# Patient Record
Sex: Female | Born: 1980 | Race: White | Hispanic: No | Marital: Single | Smoking: Current every day smoker
Health system: Southern US, Community
[De-identification: ages and names within clinical notes are randomized; demographics above are authoritative.]

## PROBLEM LIST (undated history)

## (undated) DIAGNOSIS — G43909 Migraine, unspecified, not intractable, without status migrainosus: Secondary | ICD-10-CM

## (undated) DIAGNOSIS — F32A Depression, unspecified: Secondary | ICD-10-CM

## (undated) DIAGNOSIS — F329 Major depressive disorder, single episode, unspecified: Secondary | ICD-10-CM

## (undated) DIAGNOSIS — M419 Scoliosis, unspecified: Secondary | ICD-10-CM

## (undated) DIAGNOSIS — F419 Anxiety disorder, unspecified: Secondary | ICD-10-CM

## (undated) DIAGNOSIS — J45909 Unspecified asthma, uncomplicated: Secondary | ICD-10-CM

---

## 2014-01-27 ENCOUNTER — Emergency Department (HOSPITAL_COMMUNITY): Payer: BC Managed Care – PPO

## 2014-01-27 ENCOUNTER — Emergency Department (HOSPITAL_COMMUNITY)
Admission: EM | Admit: 2014-01-27 | Discharge: 2014-01-27 | Disposition: A | Discharge status: Home / Self Care | Payer: BC Managed Care – PPO | Attending: Emergency Medicine | Admitting: Emergency Medicine

## 2014-01-27 ENCOUNTER — Encounter (HOSPITAL_COMMUNITY): Payer: Self-pay | Admitting: Emergency Medicine

## 2014-01-27 DIAGNOSIS — R5383 Other fatigue: Secondary | ICD-10-CM

## 2014-01-27 DIAGNOSIS — R42 Dizziness and giddiness: Secondary | ICD-10-CM | POA: Insufficient documentation

## 2014-01-27 DIAGNOSIS — Z8739 Personal history of other diseases of the musculoskeletal system and connective tissue: Secondary | ICD-10-CM | POA: Insufficient documentation

## 2014-01-27 DIAGNOSIS — R5381 Other malaise: Secondary | ICD-10-CM | POA: Insufficient documentation

## 2014-01-27 DIAGNOSIS — F329 Major depressive disorder, single episode, unspecified: Secondary | ICD-10-CM | POA: Insufficient documentation

## 2014-01-27 DIAGNOSIS — N12 Tubulo-interstitial nephritis, not specified as acute or chronic: Secondary | ICD-10-CM

## 2014-01-27 DIAGNOSIS — R110 Nausea: Secondary | ICD-10-CM | POA: Insufficient documentation

## 2014-01-27 DIAGNOSIS — Z79899 Other long term (current) drug therapy: Secondary | ICD-10-CM | POA: Insufficient documentation

## 2014-01-27 DIAGNOSIS — Z3202 Encounter for pregnancy test, result negative: Secondary | ICD-10-CM | POA: Insufficient documentation

## 2014-01-27 DIAGNOSIS — Z8679 Personal history of other diseases of the circulatory system: Secondary | ICD-10-CM | POA: Insufficient documentation

## 2014-01-27 DIAGNOSIS — F411 Generalized anxiety disorder: Secondary | ICD-10-CM | POA: Insufficient documentation

## 2014-01-27 DIAGNOSIS — J45909 Unspecified asthma, uncomplicated: Secondary | ICD-10-CM | POA: Insufficient documentation

## 2014-01-27 DIAGNOSIS — F17200 Nicotine dependence, unspecified, uncomplicated: Secondary | ICD-10-CM | POA: Insufficient documentation

## 2014-01-27 DIAGNOSIS — F3289 Other specified depressive episodes: Secondary | ICD-10-CM | POA: Insufficient documentation

## 2014-01-27 HISTORY — DX: Major depressive disorder, single episode, unspecified: F32.9

## 2014-01-27 HISTORY — DX: Migraine, unspecified, not intractable, without status migrainosus: G43.909

## 2014-01-27 HISTORY — DX: Depression, unspecified: F32.A

## 2014-01-27 HISTORY — DX: Scoliosis, unspecified: M41.9

## 2014-01-27 HISTORY — DX: Unspecified asthma, uncomplicated: J45.909

## 2014-01-27 HISTORY — DX: Anxiety disorder, unspecified: F41.9

## 2014-01-27 LAB — COMPREHENSIVE METABOLIC PANEL
ALT: 17 U/L (ref 0–35)
AST: 16 U/L (ref 0–37)
Albumin: 3.8 g/dL (ref 3.5–5.2)
Alkaline Phosphatase: 88 U/L (ref 39–117)
BUN: 14 mg/dL (ref 6–23)
CO2: 24 meq/L (ref 19–32)
CREATININE: 0.74 mg/dL (ref 0.50–1.10)
Calcium: 9.3 mg/dL (ref 8.4–10.5)
Chloride: 103 mEq/L (ref 96–112)
GLUCOSE: 80 mg/dL (ref 70–99)
Potassium: 4.3 mEq/L (ref 3.7–5.3)
Sodium: 140 mEq/L (ref 137–147)
Total Bilirubin: <0.2 mg/dL — ABNORMAL LOW (ref 0.3–1.2)
Total Protein: 7.5 g/dL (ref 6.0–8.3)

## 2014-01-27 LAB — URINALYSIS, ROUTINE W REFLEX MICROSCOPIC
Bilirubin Urine: NEGATIVE
Glucose, UA: NEGATIVE mg/dL
Ketones, ur: NEGATIVE mg/dL
Nitrite: NEGATIVE
Protein, ur: NEGATIVE mg/dL
SPECIFIC GRAVITY, URINE: 1.01 (ref 1.005–1.030)
UROBILINOGEN UA: 0.2 mg/dL (ref 0.0–1.0)
pH: 6 (ref 5.0–8.0)

## 2014-01-27 LAB — CBC WITH DIFFERENTIAL/PLATELET
Basophils Absolute: 0.1 10*3/uL (ref 0.0–0.1)
Basophils Relative: 1 % (ref 0–1)
EOS PCT: 8 % — AB (ref 0–5)
Eosinophils Absolute: 0.6 10*3/uL (ref 0.0–0.7)
HCT: 42 % (ref 36.0–46.0)
Hemoglobin: 14.2 g/dL (ref 12.0–15.0)
LYMPHS ABS: 2.2 10*3/uL (ref 0.7–4.0)
Lymphocytes Relative: 27 % (ref 12–46)
MCH: 32.3 pg (ref 26.0–34.0)
MCHC: 33.8 g/dL (ref 30.0–36.0)
MCV: 95.5 fL (ref 78.0–100.0)
MONO ABS: 0.5 10*3/uL (ref 0.1–1.0)
Monocytes Relative: 6 % (ref 3–12)
Neutro Abs: 4.7 10*3/uL (ref 1.7–7.7)
Neutrophils Relative %: 58 % (ref 43–77)
PLATELETS: 301 10*3/uL (ref 150–400)
RBC: 4.4 MIL/uL (ref 3.87–5.11)
RDW: 12.6 % (ref 11.5–15.5)
WBC: 8.1 10*3/uL (ref 4.0–10.5)

## 2014-01-27 LAB — URINE MICROSCOPIC-ADD ON

## 2014-01-27 LAB — PREGNANCY, URINE: Preg Test, Ur: NEGATIVE

## 2014-01-27 MED ORDER — SODIUM CHLORIDE 0.9 % IV BOLUS (SEPSIS)
2000.0000 mL | Freq: Once | INTRAVENOUS | Status: AC
  Administered 2014-01-27: 2000 mL via INTRAVENOUS

## 2014-01-27 MED ORDER — CEPHALEXIN 500 MG PO CAPS: 500.0000 mg | ORAL_CAPSULE | Freq: Four times a day (QID) | ORAL | Status: AC

## 2014-01-27 MED ORDER — DEXTROSE 5 % IV SOLN
1.0000 g | Freq: Once | INTRAVENOUS | Status: DC
  Filled 2014-01-27: qty 10

## 2014-01-27 MED ORDER — ONDANSETRON HCL 4 MG/2ML IJ SOLN: 4.0000 mg | Freq: Once | INTRAMUSCULAR | Status: DC

## 2014-01-27 NOTE — ED Notes (Signed)
Per pt, has been diagnosed with a stone years ago.  Stone is an "acid stone" and cannot be seen on CT.  Pt states last evaluated with a flare up in 2012.  Pt feels same as then with dizziness and flank pain.

## 2014-01-27 NOTE — ED Notes (Signed)
Initial Contact - pt to [facility] with visitor, reports c/o flank pain, hx "acid stone", skin PWD, speaking full/clear sentences, ambulating with steady gait.  NAD. Awaiting EDP eval.

## 2014-01-27 NOTE — ED Notes (Signed)
Pt to CT

## 2014-01-27 NOTE — ED Notes (Signed)
Pt declining zofran at this time.

## 2014-01-27 NOTE — ED Provider Notes (Signed)
CSN: [id number]     Arrival date & time 01/27/14  1531 History   First MD Initiated Contact with Patient 01/27/14 1733     Chief Complaint  Patient presents with  . Flank Pain     (Consider location/radiation/quality/duration/timing/severity/associated sxs/prior Treatment) HPI Comments: 33 year old female with right-sided flank pain since yesterday. It feels similar to multiple prior pains she's had. She's been told she has "an acid stone" that cannot be seen on CT. She states she's had multiple UTIs has never had a procedure urologically. She's never had stone out of the kidney. She states it feels like a dull throbbing pain in her right back. She states that she's been feeling dizzy and lightheaded today. She states she feels dehydrated with excessive thirst. Usually when this happens she is getting fluids and antibiotics for a UTI. She's not specifically having dysuria. No fevers or vomiting. She is nauseous. No abdominal pain. She states her right upper quadrant is always "tender" for the past 20 years but she's had her gallbladder and liver evaluated multiple times and never found any issues. Her abdomen is not hurting currently. She has never seen a urologist. She states her pain is currently about a 2/10. It does improve with sitting up.   Past Medical History  Diagnosis Date  . Scoliosis   . Depression   . Anxiety   . Asthma   . Migraines    History reviewed. No pertinent past surgical history. History reviewed. No pertinent family history. History  Substance Use Topics  . Smoking status: Current Every Day Smoker -- 0.50 packs/day    Types: Cigarettes  . Smokeless tobacco: Not on file  . Alcohol Use: No   OB History   Grav Para Term Preterm Abortions TAB SAB Ect Mult Living                 Review of Systems  Constitutional: Negative for fever.  Respiratory: Negative for shortness of breath.   Gastrointestinal: Positive for nausea. Negative for vomiting and abdominal pain.   Genitourinary: Positive for flank pain. Negative for dysuria and hematuria.  Musculoskeletal: Positive for back pain.  Neurological: Positive for weakness and light-headedness.  All other systems reviewed and are negative.      Allergies  Cymbalta and Prozac  Home Medications   Current Outpatient Rx  Name  Route  Sig  Dispense  Refill  . aspirin-acetaminophen-caffeine (EXCEDRIN MIGRAINE) 250-250-65 MG per tablet   Oral   Take 1 tablet by mouth every 6 (six) hours as needed for headache.         . diclofenac sodium (VOLTAREN) 1 % GEL   Topical   Apply 2 g topically 4 (four) times daily as needed (pain).         Marland Kitchen escitalopram (LEXAPRO) 20 MG tablet   Oral   Take 1 tablet by mouth daily.          BP 122/86  Pulse 86  Temp(Src) 98.7 F (37.1 C) (Oral)  Resp 16  SpO2 100%  LMP 01/24/2014 Physical Exam  Vitals reviewed. Constitutional: She is oriented to person, place, and time. She appears well-developed and well-nourished. No distress.  HENT:  Head: Normocephalic and atraumatic.  Right Ear: External ear normal.  Left Ear: External ear normal.  Nose: Nose normal.  Eyes: Right eye exhibits no discharge. Left eye exhibits no discharge.  Cardiovascular: Normal rate, regular rhythm and normal heart sounds.   Pulmonary/Chest: Effort normal and breath sounds normal.  Abdominal:  Soft. There is tenderness in the right upper quadrant. There is CVA tenderness (right-sided).  Neurological: She is alert and oriented to person, place, and time.  Skin: Skin is warm and dry.    ED Course  Procedures (including critical care time) Labs Review Labs Reviewed  CBC WITH DIFFERENTIAL - Abnormal; Notable for the following:    Eosinophils Relative 8 (*)    All other components within normal limits  COMPREHENSIVE METABOLIC PANEL - Abnormal; Notable for the following:    Total Bilirubin <0.2 (*)    All other components within normal limits  URINALYSIS, ROUTINE W REFLEX  MICROSCOPIC - Abnormal; Notable for the following:    Color, Urine GREEN (*)    APPearance CLOUDY (*)    Hgb urine dipstick MODERATE (*)    Leukocytes, UA MODERATE (*)    All other components within normal limits  URINE MICROSCOPIC-ADD ON - Abnormal; Notable for the following:    Squamous Epithelial / LPF FEW (*)    Bacteria, UA FEW (*)    All other components within normal limits  URINE CULTURE  PREGNANCY, URINE   Imaging Review Ct Abdomen Pelvis Wo Contrast  01/27/2014   CLINICAL DATA:  Right flank pain.  Urolithiasis.  EXAM: CT ABDOMEN AND PELVIS WITHOUT CONTRAST  TECHNIQUE: Multidetector CT imaging of the abdomen and pelvis was performed following the standard protocol without intravenous contrast.  COMPARISON:  None.  FINDINGS: No evidence of renal calculi or hydronephrosis. No evidence of ureteral calculi or dilatation. No bladder calculi identified.  Noncontrast images of the liver, gallbladder, pancreas, spleen, and adrenal glands are normal in appearance. Uterus and adnexal regions are unremarkable. No lymphadenopathy identified within the abdomen or pelvis. No evidence of inflammatory process or abnormal fluid collections. No evidence of dilated bowel loops.  IMPRESSION: No evidence of urolithiasis, hydronephrosis, or other acute findings.   Electronically Signed   By: Myles RosenthalJohn  Stahl M.D.   On: 01/27/2014 18:24     EKG Interpretation None      MDM   Final diagnoses:  Pyelonephritis    There is no evidence of ureteral or obstructing stone. Does have UTI, combined with her R flank pain will treat as if pyelo. Patient reportedly dizzy, likely from some dehydration as she feels she's not been drinking enough. After patient stuck for IV and it blew, she declined further IV fluids. She's had no vomiting and appears capable of rehydrating herself orally. Will treat with abx, oral fluids at home and give uro f/u given her recurrent symptoms and no current diagnosis of "acid  stone".    Audree CamelScott T Stefana Lodico, MD 01/28/14 [phone]

## 2014-01-27 NOTE — ED Notes (Signed)
Dr. Criss AlvineGoldston notified 2nd IV infiltrated, pt refusing more attempts at this time, reports feeling better.  Dr. Criss AlvineGoldston to bs for eval.

## 2014-01-29 LAB — URINE CULTURE: Colony Count: 50000

## 2014-03-17 ENCOUNTER — Emergency Department (HOSPITAL_BASED_OUTPATIENT_CLINIC_OR_DEPARTMENT_OTHER)
Admission: EM | Admit: 2014-03-17 | Discharge: 2014-03-17 | Disposition: A | Discharge status: Home / Self Care | Payer: BC Managed Care – PPO | Attending: Emergency Medicine | Admitting: Emergency Medicine

## 2014-03-17 ENCOUNTER — Encounter (HOSPITAL_BASED_OUTPATIENT_CLINIC_OR_DEPARTMENT_OTHER): Payer: Self-pay | Admitting: Emergency Medicine

## 2014-03-17 DIAGNOSIS — Z792 Long term (current) use of antibiotics: Secondary | ICD-10-CM | POA: Insufficient documentation

## 2014-03-17 DIAGNOSIS — F419 Anxiety disorder, unspecified: Secondary | ICD-10-CM

## 2014-03-17 DIAGNOSIS — M546 Pain in thoracic spine: Secondary | ICD-10-CM | POA: Insufficient documentation

## 2014-03-17 DIAGNOSIS — R209 Unspecified disturbances of skin sensation: Secondary | ICD-10-CM | POA: Insufficient documentation

## 2014-03-17 DIAGNOSIS — G43909 Migraine, unspecified, not intractable, without status migrainosus: Secondary | ICD-10-CM | POA: Insufficient documentation

## 2014-03-17 DIAGNOSIS — M4120 Other idiopathic scoliosis, site unspecified: Secondary | ICD-10-CM | POA: Insufficient documentation

## 2014-03-17 DIAGNOSIS — Z79899 Other long term (current) drug therapy: Secondary | ICD-10-CM | POA: Insufficient documentation

## 2014-03-17 DIAGNOSIS — F17200 Nicotine dependence, unspecified, uncomplicated: Secondary | ICD-10-CM | POA: Insufficient documentation

## 2014-03-17 DIAGNOSIS — J45909 Unspecified asthma, uncomplicated: Secondary | ICD-10-CM | POA: Insufficient documentation

## 2014-03-17 DIAGNOSIS — F411 Generalized anxiety disorder: Secondary | ICD-10-CM | POA: Insufficient documentation

## 2014-03-17 MED ORDER — LORAZEPAM 1 MG PO TABS
1.0000 mg | ORAL_TABLET | Freq: Once | ORAL | Status: AC
  Administered 2014-03-17: 1 mg via ORAL
  Filled 2014-03-17: qty 1

## 2014-03-17 MED ORDER — LORAZEPAM 1 MG PO TABS: 1.0000 mg | ORAL_TABLET | Freq: Three times a day (TID) | ORAL | Status: AC | PRN

## 2014-03-17 NOTE — ED Notes (Addendum)
Pt c/o increased depression and anxiety x 2 days pt denies SI

## 2014-03-17 NOTE — ED Provider Notes (Signed)
CSN: [id number]     Arrival date & time 03/17/14  2125 History  This chart was scribed for Rolan BuccoMelanie Zyona Pettaway, MD by Ellin MayhewMichael Levi, ED Scribe. This patient was seen in room MH12/MH12 and the patient's care was started at 10:09 PM.  The history is provided by the patient. No language interpreter was used.   HPI Comments: Miranda Schwartz is a 33 y.o. female who presents to the Emergency Department with a chief complaint of recurrent anxiety and depression. Patient states she has a history of chronic depression and anxiety that has been ongoing since she was 33 years old. Patient reports that last night at work she had an panic attack and had a subsequent panic attack on her way to work this morning. She reports she has had similar episodes in the past; however, that it had been several years since her last panic attack. Patient states she has been taking Lexapro for her depression for 5 months with a recent dosage increase occuring one month ago. Patient states she sees Maris Berger[place]athy [place]irst[place] in [place] for her psychiatric symptom management. Additionally, patient reports that she has had recurrent insomnia, which is not a new symptom. She denies any SI, HI or hallucinations.  With her panic attacks, she has had some tingling to the left side of the mouth, some tingling to the right hand.  Denies ataxia, no speech changes/facial drooping, no headache, no weakness to the extremities.   Past Medical History  Diagnosis Date  . Scoliosis   . Depression   . Anxiety   . Asthma   . Migraines    History reviewed. No pertinent past surgical history. History reviewed. No pertinent family history. History  Substance Use Topics  . Smoking status: Current Every Day Smoker -- 0.50 packs/day    Types: Cigarettes  . Smokeless tobacco: Not on file  . Alcohol Use: No   OB History   Grav Para Term Preterm Abortions TAB SAB Ect Mult Living                 Review of Systems  Constitutional: Negative for fever,  chills, diaphoresis and fatigue.  HENT: Negative for congestion, rhinorrhea and sneezing.   Eyes: Negative.   Respiratory: Negative for cough, chest tightness and shortness of breath.   Cardiovascular: Negative for chest pain and leg swelling.  Gastrointestinal: Negative for nausea, vomiting, abdominal pain, diarrhea and blood in stool.  Genitourinary: Negative for frequency, hematuria, flank pain and difficulty urinating.  Musculoskeletal: Positive for back pain (Upper back pain, chronic from scoliosis). Negative for arthralgias.  Skin: Negative for rash.  Neurological: Positive for numbness (paresthesias). Negative for dizziness, speech difficulty, weakness and headaches.  Psychiatric/Behavioral: Positive for sleep disturbance (Insomnia) and dysphoric mood. Negative for suicidal ideas, hallucinations, behavioral problems, confusion and self-injury. The patient is nervous/anxious.   All other systems reviewed and are negative.  Allergies  Cymbalta and Prozac  Home Medications   Prior to Admission medications   Medication Sig Start Date End Date Taking? Authorizing Provider  aspirin-acetaminophen-caffeine (EXCEDRIN MIGRAINE) 504-252-5021250-250-65 MG per tablet Take 1 tablet by mouth every 6 (six) hours as needed for headache.    Historical Provider, MD  cephALEXin (KEFLEX) 500 MG capsule Take 1 capsule (500 mg total) by mouth 4 (four) times daily. X 10 days 01/27/14   Audree CamelScott T Goldston, MD  diclofenac sodium (VOLTAREN) 1 % GEL Apply 2 g topically 4 (four) times daily as needed (pain).    Historical Provider, MD  escitalopram (LEXAPRO) 20  MG tablet Take 1 tablet by mouth daily. 12/02/13   Historical Provider, MD   Triage Vitals: BP 141/99  Pulse 81  Temp(Src) 97.9 F (36.6 C)  Resp 18  Ht 5\' 6"  (1.676 m)  Wt 200 lb (90.719 kg)  BMI 32.30 kg/m2  SpO2 100%  LMP 02/18/2014  Physical Exam  Constitutional: She is oriented to person, place, and time. She appears well-developed and well-nourished.   HENT:  Head: Normocephalic and atraumatic.  Eyes: Pupils are equal, round, and reactive to light.  Neck: Normal range of motion. Neck supple.  Cardiovascular: Normal rate, regular rhythm and normal heart sounds.   Pulmonary/Chest: Effort normal and breath sounds normal. No respiratory distress. She has no wheezes. She has no rales. She exhibits no tenderness.  Abdominal: Soft. Bowel sounds are normal. There is no tenderness. There is no rebound and no guarding.  Musculoskeletal: Normal range of motion. She exhibits no edema.  Lymphadenopathy:    She has no cervical adenopathy.  Neurological: She is alert and oriented to person, place, and time. She has normal strength. No cranial nerve deficit or sensory deficit.  Motor 5/5 all extremities.  Sensations grossly intact all extremities, no facial drooping, no aphasia.  Normal gait.  No pronator drift.  Skin: Skin is warm and dry. No rash noted.  Psychiatric: Her mood appears anxious. Her speech is not slurred. She is not withdrawn and not actively hallucinating. She exhibits a depressed mood. She expresses no homicidal and no suicidal ideation. She expresses no suicidal plans and no homicidal plans. She is communicative.    ED Course  Procedures (including critical care time)  COORDINATION OF CARE: 10:14 PM-Discussed treatment options for patient to reduce anxiety. Discussed that changes to the psychiatric medication regimen is best discussed with the psychiatrist. Treatment plan discussed with patient and patient agrees.  MDM   Final diagnoses:  Anxiety    Patient presents with anxiety and panic attacks. She has no suicidal ideations. She states she's been with depression all her life and has never had any thoughts or attempts at suicide. She was given a dose of Ativan in the ED and was feeling much better after this. All her paresthesias resolved. She was discharged home with her partner. She will followup with her psychiatrist tomorrow.  She was given 5 tablets of Ativan to use until she can see her psychiatrist.  I personally performed the services described in this documentation, which was scribed in my presence. The recorded information has been reviewed and is accurate.    Rolan BuccoMelanie Laronica Bhagat, MD 03/17/14 [phone]

## 2014-03-17 NOTE — Discharge Instructions (Signed)
Panic Attacks  Panic attacks are sudden, short-lived surges of severe anxiety, fear, or discomfort. They may occur for no reason when you are relaxed, when you are anxious, or when you are sleeping. Panic attacks may occur for a number of reasons:   · Healthy people occasionally have panic attacks in extreme, life-threatening situations, such as war or natural disasters. Normal anxiety is a protective mechanism of the body that helps us react to danger (fight or flight response).  · Panic attacks are often seen with anxiety disorders, such as panic disorder, social anxiety disorder, generalized anxiety disorder, and phobias. Anxiety disorders cause excessive or uncontrollable anxiety. They may interfere with your relationships or other life activities.  · Panic attacks are sometimes seen with other mental illnesses such as depression and posttraumatic stress disorder.  · Certain medical conditions, prescription medicines, and drugs of abuse can cause panic attacks.  SYMPTOMS   Panic attacks start suddenly, peak within 20 minutes, and are accompanied by four or more of the following symptoms:  · Pounding heart or fast heart rate (palpitations).  · Sweating.  · Trembling or shaking.  · Shortness of breath or feeling smothered.  · Feeling choked.  · Chest pain or discomfort.  · Nausea or strange feeling in your stomach.  · Dizziness, lightheadedness, or feeling like you will faint.  · Chills or hot flushes.  · Numbness or tingling in your lips or hands and feet.  · Feeling that things are not real or feeling that you are not yourself.  · Fear of losing control or going crazy.  · Fear of dying.  Some of these symptoms can mimic serious medical conditions. For example, you may think you are having a heart attack. Although panic attacks can be very scary, they are not life threatening.  DIAGNOSIS   Panic attacks are diagnosed through an assessment by your health care provider. Your health care provider will ask questions  about your symptoms, such as where and when they occurred. Your health care provider will also ask about your medical history and use of alcohol and drugs, including prescription medicines. Your health care provider may order blood tests or other studies to rule out a serious medical condition. Your health care provider may refer you to a mental health professional for further evaluation.  TREATMENT   · Most healthy people who have one or two panic attacks in an extreme, life-threatening situation will not require treatment.  · The treatment for panic attacks associated with anxiety disorders or other mental illness typically involves counseling with a mental health professional, medicine, or a combination of both. Your health care provider will help determine what treatment is best for you.  · Panic attacks due to physical illness usually goes away with treatment of the illness. If prescription medicine is causing panic attacks, talk with your health care provider about stopping the medicine, decreasing the dose, or substituting another medicine.  · Panic attacks due to alcohol or drug abuse goes away with abstinence. Some adults need professional help in order to stop drinking or using drugs.  HOME CARE INSTRUCTIONS   · Take all your medicines as prescribed.    · Check with your health care provider before starting new prescription or over-the-counter medicines.  · Keep all follow up appointments with your health care provider.  SEEK MEDICAL CARE IF:  · You are not able to take your medicines as prescribed.  · Your symptoms do not improve or get worse.  SEEK IMMEDIATE   MEDICAL CARE IF:   · You experience panic attack symptoms that are different than your usual symptoms.  · You have serious thoughts about hurting yourself or others.  · You are taking medicine for panic attacks and have a serious side effect.  MAKE SURE YOU:  · Understand these instructions.  · Will watch your condition.  · Will get help right away  if you are not doing well or get worse.  Document Released: 11/12/2005 Document Revised: 09/02/2013 Document Reviewed: 06/26/2013  [employer]® Patient Information ©2014 [employer].

## 2015-09-02 IMAGING — CT CT ABD-PELV W/O CM
1 series · 16 of 25 positions shown, 20 images · non-contrast
Comparison: None.

CLINICAL DATA: Right flank pain.  Urolithiasis.

EXAM:
CT ABDOMEN AND PELVIS WITHOUT CONTRAST
TECHNIQUE: Multidetector CT imaging of the abdomen and pelvis was performed
following the standard protocol without intravenous contrast.

[Series 6: lung · axial · 0.72mm/px · z∈[-576,-466]mm · 16 of 25 slices shown, 20 images]
[im 2/25  soft-tissue]
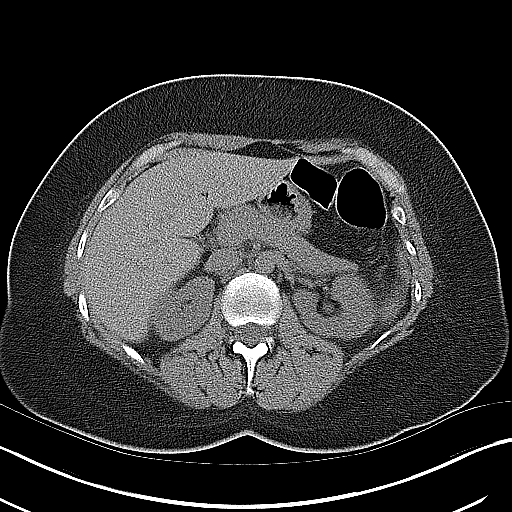
[im 2/25  bone]
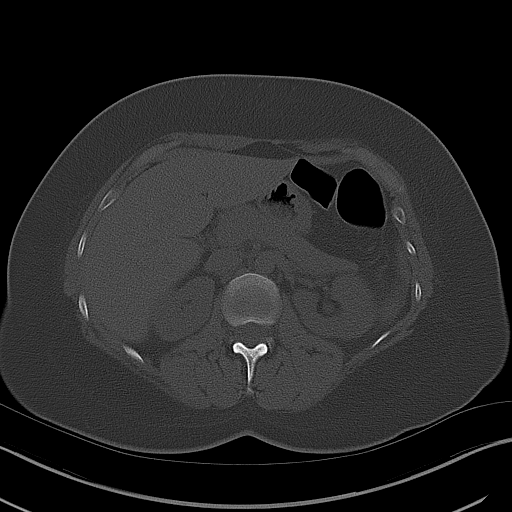
[im 4/25  soft-tissue]
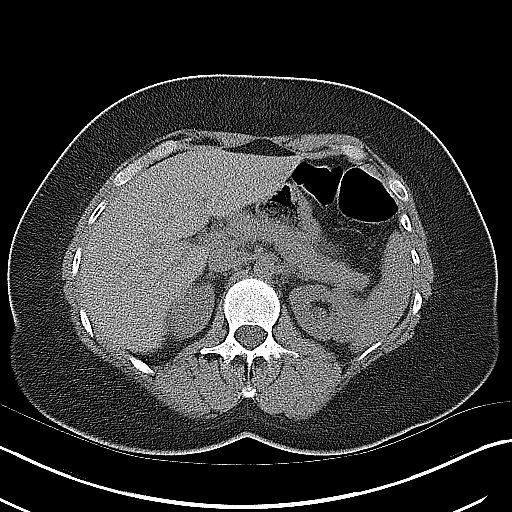
[im 6/25  soft-tissue]
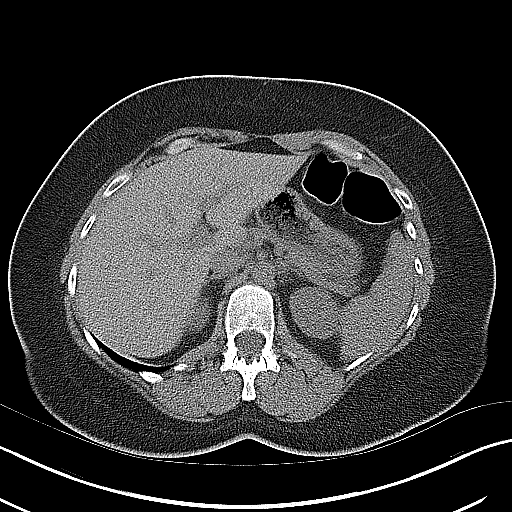
[im 7/25  soft-tissue]
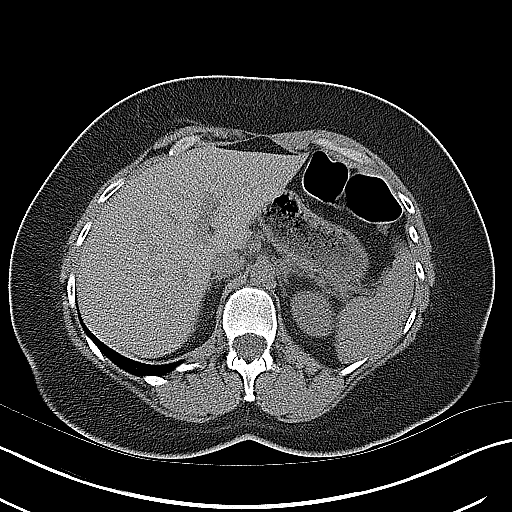
[im 9/25  soft-tissue]
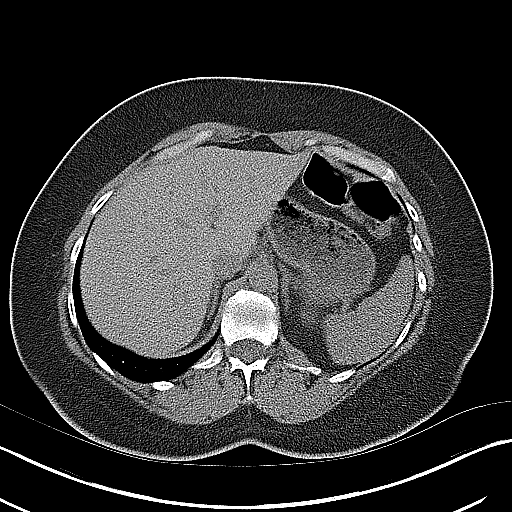
[im 11/25  soft-tissue]
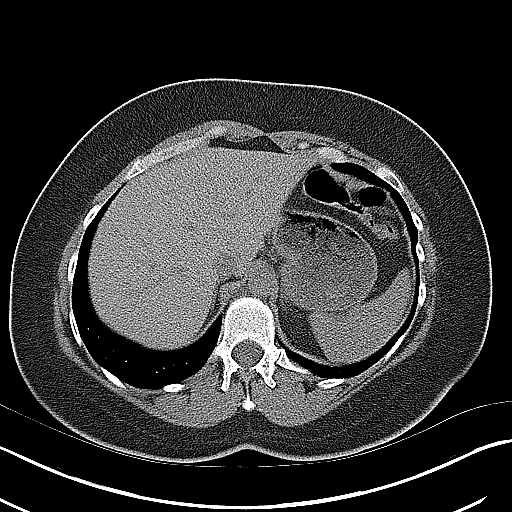
[im 12/25  soft-tissue]
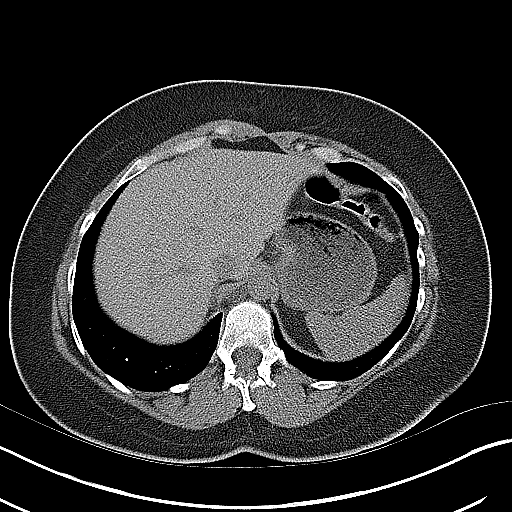
[im 14/25  soft-tissue]
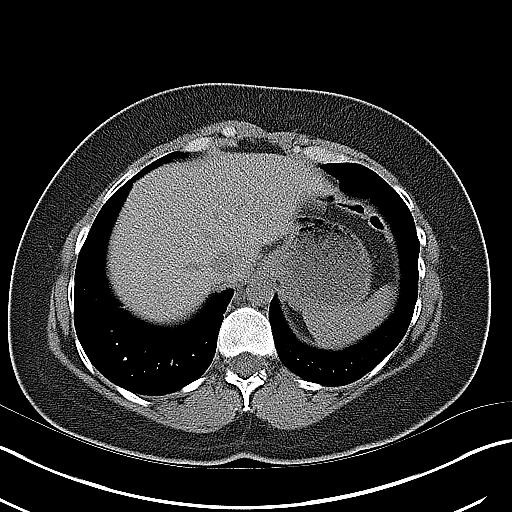
[im 15/25  soft-tissue]
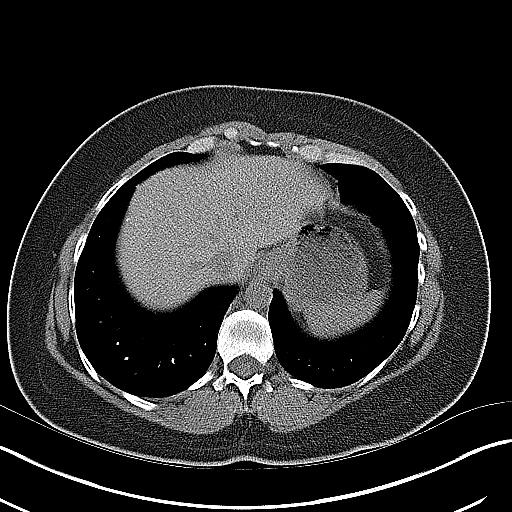
[im 15/25  bone]
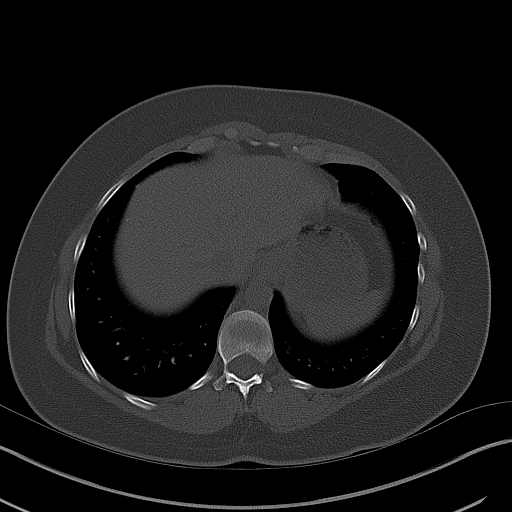
[im 17/25  soft-tissue]
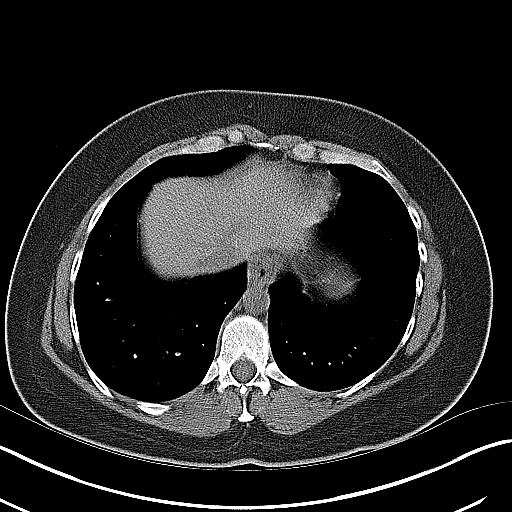
[im 19/25  soft-tissue]
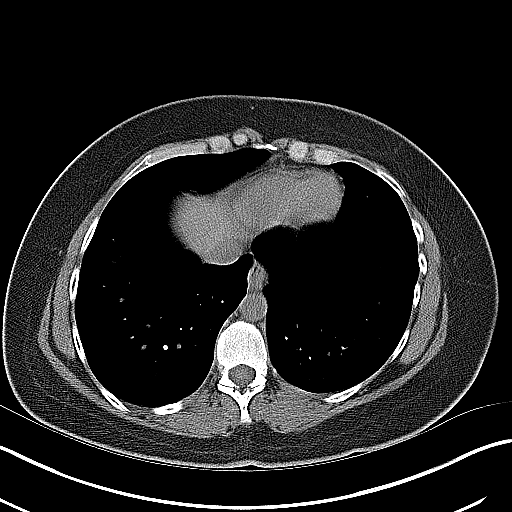
[im 20/25  soft-tissue]
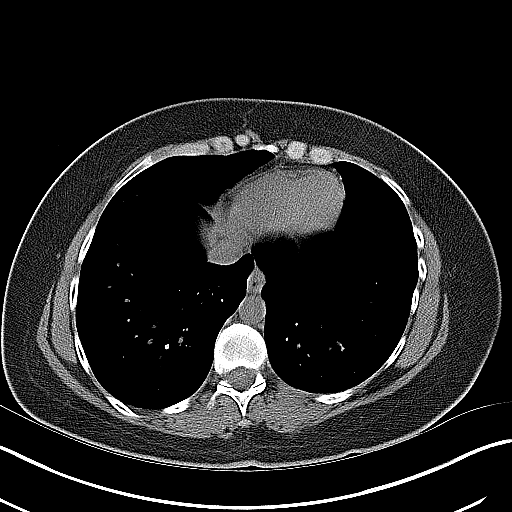
[im 21/25  lung]
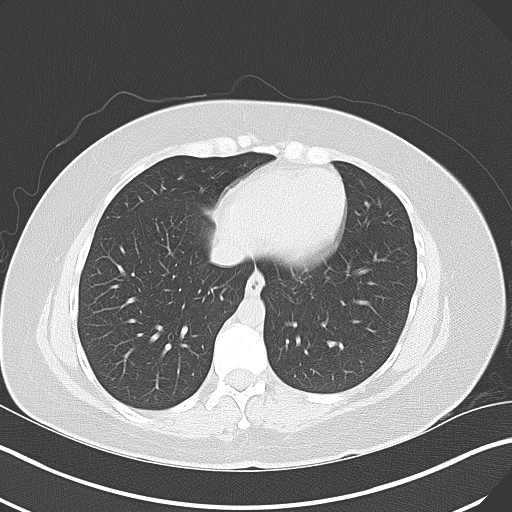
[im 22/25  soft-tissue]
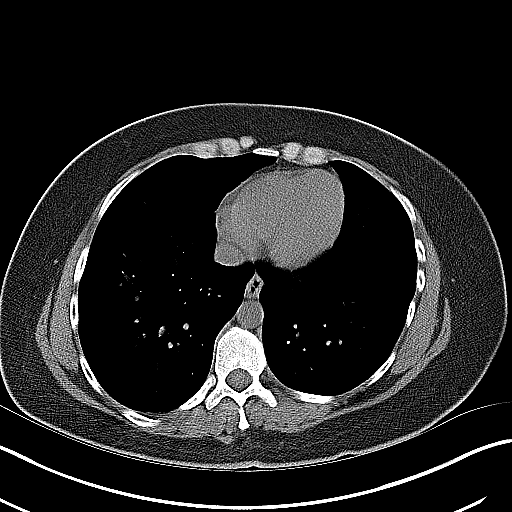
[im 22/25  lung]
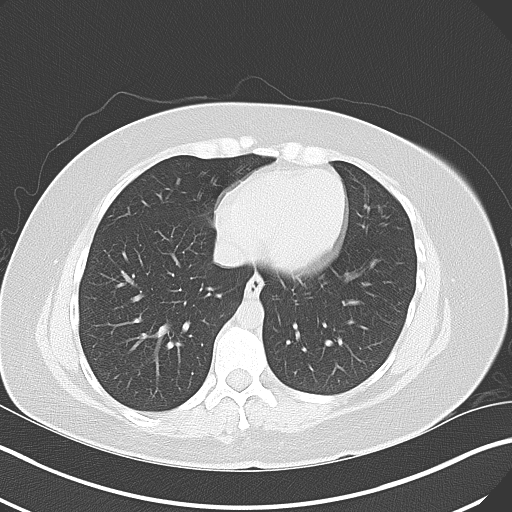
[im 23/25  lung]
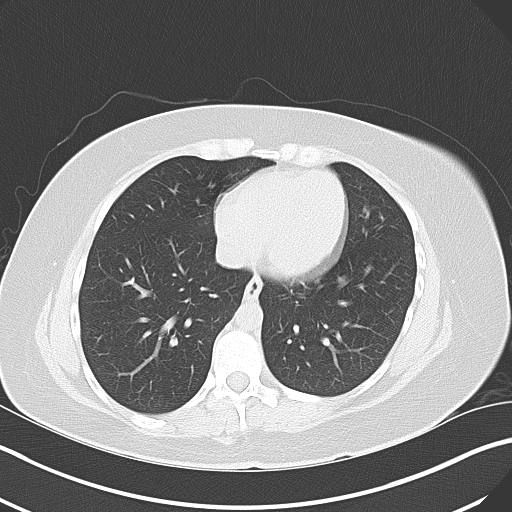
[im 24/25  soft-tissue]
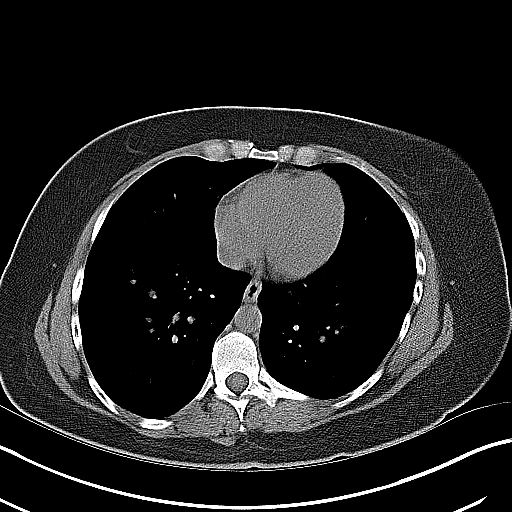
[im 24/25  lung]
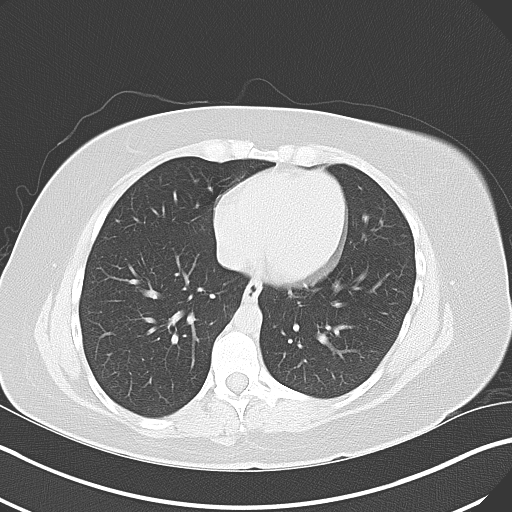

[16 of 25 positions shown; findings below may reference images not displayed]

FINDINGS: No evidence of renal calculi or hydronephrosis. No evidence of
ureteral calculi or dilatation. No bladder calculi identified.

Noncontrast images of the liver, gallbladder, pancreas, spleen, and
adrenal glands are normal in appearance. Uterus and adnexal regions
are unremarkable. No lymphadenopathy identified within the abdomen
or pelvis. No evidence of inflammatory process or abnormal fluid
collections. No evidence of dilated bowel loops.
IMPRESSION: No evidence of urolithiasis, hydronephrosis, or other acute
findings.
